# Patient Record
Sex: Female | Born: 1975 | Race: White | Hispanic: No | Marital: Married
Health system: Southern US, Community
[De-identification: ages and names within clinical notes are randomized; demographics above are authoritative.]

---

## 2003-11-02 ENCOUNTER — Other Ambulatory Visit: Admission: RE | Admit: 2003-11-02 | Discharge: 2003-11-02 | Payer: Self-pay | Admitting: Obstetrics and Gynecology

## 2003-12-01 ENCOUNTER — Ambulatory Visit (HOSPITAL_COMMUNITY): Admission: RE | Admit: 2003-12-01 | Discharge: 2003-12-01 | Payer: Self-pay | Admitting: Obstetrics and Gynecology

## 2003-12-01 ENCOUNTER — Encounter (INDEPENDENT_AMBULATORY_CARE_PROVIDER_SITE_OTHER): Payer: Self-pay | Admitting: Specialist

## 2005-01-02 ENCOUNTER — Ambulatory Visit (HOSPITAL_COMMUNITY): Admission: RE | Admit: 2005-01-02 | Discharge: 2005-01-02 | Payer: Self-pay | Admitting: Obstetrics and Gynecology

## 2005-04-07 ENCOUNTER — Inpatient Hospital Stay (HOSPITAL_COMMUNITY): Admission: AD | Admit: 2005-04-07 | Discharge: 2005-04-10 | Payer: Self-pay | Admitting: Obstetrics and Gynecology

## 2005-05-09 ENCOUNTER — Other Ambulatory Visit: Admission: RE | Admit: 2005-05-09 | Discharge: 2005-05-09 | Payer: Self-pay | Admitting: Obstetrics & Gynecology

## 2007-02-15 ENCOUNTER — Ambulatory Visit (HOSPITAL_COMMUNITY): Admission: RE | Admit: 2007-02-15 | Discharge: 2007-02-15 | Payer: Self-pay | Admitting: Obstetrics and Gynecology

## 2007-05-18 ENCOUNTER — Inpatient Hospital Stay (HOSPITAL_COMMUNITY): Admission: AD | Admit: 2007-05-18 | Discharge: 2007-05-20 | Payer: Self-pay | Admitting: Obstetrics & Gynecology

## 2010-08-12 ENCOUNTER — Inpatient Hospital Stay (HOSPITAL_COMMUNITY)
Admission: AD | Admit: 2010-08-12 | Discharge: 2010-08-14 | DRG: 373 | Disposition: A | Discharge status: Home / Self Care | Payer: BC Managed Care – PPO | Source: Ambulatory Visit | Attending: Obstetrics and Gynecology | Admitting: Obstetrics and Gynecology

## 2010-08-12 ENCOUNTER — Inpatient Hospital Stay (HOSPITAL_COMMUNITY): Admission: AD | Admit: 2010-08-12 | Payer: Self-pay | Source: Ambulatory Visit | Admitting: Obstetrics and Gynecology

## 2010-08-12 DIAGNOSIS — O09529 Supervision of elderly multigravida, unspecified trimester: Principal | ICD-10-CM | POA: Diagnosis present

## 2010-08-12 LAB — CBC
HCT: 39.7 % (ref 36.0–46.0)
Hemoglobin: 13.5 g/dL (ref 12.0–15.0)
MCH: 31.8 pg (ref 26.0–34.0)
MCHC: 34 g/dL (ref 30.0–36.0)
MCV: 93.6 fL (ref 78.0–100.0)
RBC: 4.24 MIL/uL (ref 3.87–5.11)

## 2010-08-13 LAB — RPR: RPR Ser Ql: NONREACTIVE

## 2010-08-13 LAB — CBC
HCT: 35.7 % — ABNORMAL LOW (ref 36.0–46.0)
Hemoglobin: 11.8 g/dL — ABNORMAL LOW (ref 12.0–15.0)
MCH: 31.1 pg (ref 26.0–34.0)
MCHC: 33.1 g/dL (ref 30.0–36.0)
MCV: 93.9 fL (ref 78.0–100.0)
RDW: 13.9 % (ref 11.5–15.5)

## 2010-08-14 LAB — RH IMMUNE GLOB WKUP(>/=20WKS)(NOT WOMEN'S HOSP): Unit division: 0

## 2010-10-25 NOTE — Op Note (Signed)
NAME:  Terri Stein, Terri Stein                       ACCOUNT NO.:  [id number]   MEDICAL RECORD NO.:  [id number]                   PATIENT TYPE:  AMB   LOCATION:  [facility]                                  FACILITY:  [facility]   PHYSICIAN:  Carrington Clamp, M.D.              DATE OF BIRTH:  May 25, 1976   DATE OF PROCEDURE:  12/01/2003  DATE OF DISCHARGE:                                 OPERATIVE REPORT   PREOPERATIVE DIAGNOSES:  Missed abortion.   POSTOPERATIVE DIAGNOSES:  Missed abortion.   PROCEDURE:  Suction, dilation and curettage.   SURGEON:  Carrington Clamp, M.D.   ANESTHESIA:  General.   ESTIMATED BLOOD LOSS:  __________   IV FLUIDS:  __________   URINE OUTPUT:  Not measured.   MEDICATIONS:  1% lidocaine with epinephrine and Methergine.   COMPLICATIONS:  None.   FINDINGS:  About a seven week size uterus that sounded to 10 cm that was  down to six week size post procedure. There was good __________.   PATHOLOGY:  Products of conception.   COUNTS:  Correct x3.   TECHNIQUE:  After adequate anesthesia was achieved, the patient was prepped  and draped in the usual sterile fashion in dorsal lithotomy position.  A  single tooth tenaculum was used to grasp the cervix and the cervix injected  with 10 mL of 1% lidocaine with epinephrine. The cervix was then dilated  with [facility] dilators and an 8 mm suction curette passed.  Suction curettage  was performed alternating with sharp curettage until all products of  conception were removed.  The uterus clamped down after the procedure and  Methergine was given to ensure hemostasis.  All instruments were then  withdrawn from the vagina and the patient tolerated the procedure well and  was returned to the recovery room in stable condition.                                               Carrington Clamp, M.D.   MH/MEDQ  D:  12/01/2003  T:  12/02/2003  Job:  [phone]

## 2011-03-17 LAB — CBC
HCT: 30.8 — ABNORMAL LOW
Hemoglobin: 10.8 — ABNORMAL LOW
Hemoglobin: 14.2
MCHC: 35.2
MCHC: 35.2
RBC: 3.26 — ABNORMAL LOW
RBC: 4.28
RDW: 13.7

## 2011-03-17 LAB — RH IMMUNE GLOB WKUP(>/=20WKS)(NOT WOMEN'S HOSP): Fetal Screen: POSITIVE

## 2011-03-17 LAB — KLEIHAUER-BETKE STAIN
# Vials RhIg: 1
Quantitation Fetal Hemoglobin: 5

## 2011-03-17 LAB — RPR: RPR Ser Ql: NONREACTIVE

## 2011-03-21 LAB — RH IMMUNE GLOBULIN WORKUP (NOT WOMEN'S HOSP): Antibody Screen: NEGATIVE

## 2014-04-25 ENCOUNTER — Other Ambulatory Visit: Payer: Self-pay | Admitting: Obstetrics and Gynecology

## 2014-04-27 LAB — CYTOLOGY - PAP

## 2015-10-04 DIAGNOSIS — M545 Low back pain: Secondary | ICD-10-CM | POA: Diagnosis not present

## 2016-02-21 DIAGNOSIS — Z23 Encounter for immunization: Secondary | ICD-10-CM | POA: Diagnosis not present

## 2016-02-21 DIAGNOSIS — T148 Other injury of unspecified body region: Secondary | ICD-10-CM | POA: Diagnosis not present

## 2016-10-09 DIAGNOSIS — M9901 Segmental and somatic dysfunction of cervical region: Secondary | ICD-10-CM | POA: Diagnosis not present

## 2016-10-09 DIAGNOSIS — M9907 Segmental and somatic dysfunction of upper extremity: Secondary | ICD-10-CM | POA: Diagnosis not present

## 2016-10-09 DIAGNOSIS — M7541 Impingement syndrome of right shoulder: Secondary | ICD-10-CM | POA: Diagnosis not present

## 2016-10-09 DIAGNOSIS — M9902 Segmental and somatic dysfunction of thoracic region: Secondary | ICD-10-CM | POA: Diagnosis not present

## 2016-10-14 DIAGNOSIS — M9901 Segmental and somatic dysfunction of cervical region: Secondary | ICD-10-CM | POA: Diagnosis not present

## 2016-10-14 DIAGNOSIS — M7541 Impingement syndrome of right shoulder: Secondary | ICD-10-CM | POA: Diagnosis not present

## 2016-10-14 DIAGNOSIS — M9902 Segmental and somatic dysfunction of thoracic region: Secondary | ICD-10-CM | POA: Diagnosis not present

## 2016-10-14 DIAGNOSIS — M9907 Segmental and somatic dysfunction of upper extremity: Secondary | ICD-10-CM | POA: Diagnosis not present

## 2017-01-13 DIAGNOSIS — J069 Acute upper respiratory infection, unspecified: Secondary | ICD-10-CM | POA: Diagnosis not present

## 2017-03-20 DIAGNOSIS — Z23 Encounter for immunization: Secondary | ICD-10-CM | POA: Diagnosis not present

## 2017-04-07 DIAGNOSIS — Z0000 Encounter for general adult medical examination without abnormal findings: Secondary | ICD-10-CM | POA: Diagnosis not present

## 2017-04-22 DIAGNOSIS — F4322 Adjustment disorder with anxiety: Secondary | ICD-10-CM | POA: Diagnosis not present

## 2017-05-07 DIAGNOSIS — F4322 Adjustment disorder with anxiety: Secondary | ICD-10-CM | POA: Diagnosis not present

## 2017-05-12 DIAGNOSIS — R0989 Other specified symptoms and signs involving the circulatory and respiratory systems: Secondary | ICD-10-CM | POA: Diagnosis not present

## 2017-05-15 DIAGNOSIS — Z6827 Body mass index (BMI) 27.0-27.9, adult: Secondary | ICD-10-CM | POA: Diagnosis not present

## 2017-05-15 DIAGNOSIS — Z01419 Encounter for gynecological examination (general) (routine) without abnormal findings: Secondary | ICD-10-CM | POA: Diagnosis not present

## 2017-05-15 DIAGNOSIS — Z1231 Encounter for screening mammogram for malignant neoplasm of breast: Secondary | ICD-10-CM | POA: Diagnosis not present

## 2017-05-15 DIAGNOSIS — Z124 Encounter for screening for malignant neoplasm of cervix: Secondary | ICD-10-CM | POA: Diagnosis not present

## 2017-05-21 ENCOUNTER — Other Ambulatory Visit: Payer: Self-pay | Admitting: Obstetrics and Gynecology

## 2017-05-21 DIAGNOSIS — R928 Other abnormal and inconclusive findings on diagnostic imaging of breast: Secondary | ICD-10-CM

## 2017-05-27 DIAGNOSIS — F4322 Adjustment disorder with anxiety: Secondary | ICD-10-CM | POA: Diagnosis not present

## 2017-06-12 DIAGNOSIS — F4322 Adjustment disorder with anxiety: Secondary | ICD-10-CM | POA: Diagnosis not present

## 2017-06-24 DIAGNOSIS — F4322 Adjustment disorder with anxiety: Secondary | ICD-10-CM | POA: Diagnosis not present

## 2017-07-15 DIAGNOSIS — F4322 Adjustment disorder with anxiety: Secondary | ICD-10-CM | POA: Diagnosis not present

## 2017-07-20 ENCOUNTER — Ambulatory Visit
Admission: RE | Admit: 2017-07-20 | Discharge: 2017-07-20 | Disposition: A | Discharge status: Home / Self Care | Payer: BC Managed Care – PPO | Source: Ambulatory Visit | Attending: Obstetrics and Gynecology | Admitting: Obstetrics and Gynecology

## 2017-07-20 ENCOUNTER — Ambulatory Visit: Payer: BC Managed Care – PPO

## 2017-07-20 DIAGNOSIS — R922 Inconclusive mammogram: Secondary | ICD-10-CM | POA: Diagnosis not present

## 2017-07-20 DIAGNOSIS — R928 Other abnormal and inconclusive findings on diagnostic imaging of breast: Secondary | ICD-10-CM

## 2017-07-30 DIAGNOSIS — F4322 Adjustment disorder with anxiety: Secondary | ICD-10-CM | POA: Diagnosis not present

## 2017-08-19 DIAGNOSIS — F4322 Adjustment disorder with anxiety: Secondary | ICD-10-CM | POA: Diagnosis not present

## 2018-02-22 DIAGNOSIS — Z111 Encounter for screening for respiratory tuberculosis: Secondary | ICD-10-CM | POA: Diagnosis not present

## 2018-02-22 DIAGNOSIS — Z23 Encounter for immunization: Secondary | ICD-10-CM | POA: Diagnosis not present

## 2018-05-04 DIAGNOSIS — Z13220 Encounter for screening for lipoid disorders: Secondary | ICD-10-CM | POA: Diagnosis not present

## 2018-05-04 DIAGNOSIS — Z131 Encounter for screening for diabetes mellitus: Secondary | ICD-10-CM | POA: Diagnosis not present

## 2018-05-04 DIAGNOSIS — Z8342 Family history of familial hypercholesterolemia: Secondary | ICD-10-CM | POA: Diagnosis not present

## 2018-05-04 DIAGNOSIS — Z0000 Encounter for general adult medical examination without abnormal findings: Secondary | ICD-10-CM | POA: Diagnosis not present

## 2018-12-04 ENCOUNTER — Telehealth: Payer: BC Managed Care – PPO | Admitting: Family

## 2018-12-04 DIAGNOSIS — Z20822 Contact with and (suspected) exposure to covid-19: Secondary | ICD-10-CM

## 2018-12-04 NOTE — Progress Notes (Signed)
E-Visit for Corona Virus Screening   Your current symptoms could be consistent with the coronavirus.  Call your health care provider or local health department to request and arrange formal testing. Many health care providers can now test patients at their office but not all are.  Please quarantine yourself while awaiting your test results.  [facility] [phone], [facility] [phone], [facility] [phone] or visit [email or web address]     COVID-19 is a respiratory illness with symptoms that are similar to the flu. Symptoms are typically mild to moderate, but there have been cases of severe illness and death due to the virus. The following symptoms may appear 2-14 days after exposure: . Fever . Cough . Shortness of breath or difficulty breathing . Chills . Repeated shaking with chills . Muscle pain . Headache . Sore throat . New loss of taste or smell . Fatigue . Congestion or runny nose . Nausea or vomiting . Diarrhea  It is vitally important that if you feel that you have an infection such as this virus or any other virus that you stay home and away from places where you may spread it to others.  You should self-quarantine for 14 days if you have symptoms that could potentially be coronavirus or have been in close contact a with a person diagnosed with COVID-19 within the last 2 weeks. You should avoid contact with people age 65 and older.   You should wear a mask or cloth face covering over your nose and mouth if you must be around other people or animals, including pets (even at home). Try to stay at least 6 feet away from other people. This will protect the people around you.   You may also take acetaminophen (Tylenol) as needed for fever.   Reduce your risk of any infection by using the same precautions used for avoiding the common cold or flu:   . Wash your hands often with soap and warm water for at least 20 seconds.  If soap and water are not readily available, use an alcohol-based hand sanitizer with at least 60% alcohol.  . If coughing or sneezing, cover your mouth and nose by coughing or sneezing into the elbow areas of your shirt or coat, into a tissue or into your sleeve (not your hands). . Avoid shaking hands with others and consider head nods or verbal greetings only. . Avoid touching your eyes, nose, or mouth with unwashed hands.  . Avoid close contact with people who are sick. . Avoid places or events with large numbers of people in one location, like concerts or sporting events. . Carefully consider travel plans you have or are making. . If you are planning any travel outside or inside the [place], visit the CDC's Travelers' Health webpage for the latest health notices. . If you have some symptoms but not all symptoms, continue to monitor at home and seek medical attention if your symptoms worsen. . If you are having a medical emergency, call 911.  HOME CARE . Only take medications as instructed by your medical team. . Drink plenty of fluids and get plenty of rest. . A steam or ultrasonic humidifier can help if you have congestion.   GET HELP RIGHT AWAY IF YOU HAVE EMERGENCY WARNING SIGNS** FOR COVID-19. If you or someone is showing any of these signs seek emergency medical care immediately. Call 911 or proceed to your closest emergency facility if: . You develop worsening high fever. . Trouble   breathing . Bluish lips or face . Persistent pain or pressure in the chest . New confusion . Inability to wake or stay awake . You cough up blood. . Your symptoms become more severe  **This list is not all possible symptoms. Contact your medical provider for any symptoms that are sever or concerning to you.   MAKE SURE YOU   Understand these instructions.  Will watch your condition.  Will get help right away if you are not  doing well or get worse.  Your e-visit answers were reviewed by a board certified advanced clinical practitioner to complete your personal care plan.  Depending on the condition, your plan could have included both over the counter or prescription medications.  If there is a problem please reply once you have received a response from your provider.  Your safety is important to us.  If you have drug allergies check your prescription carefully.    You can use MyChart to ask questions about today's visit, request a non-urgent call back, or ask for a work or school excuse for 24 hours related to this e-Visit. If it has been greater than 24 hours you will need to follow up with your provider, or enter a new e-Visit to address those concerns. You will get an e-mail in the next two days asking about your experience.  I hope that your e-visit has been valuable and will speed your recovery. Thank you for using e-visits.  Greater than 5 minutes, yet less than 10 minutes of time have been spent researching, coordinating, and implementing care for this patient today.  Thank you for the details you included in the comment boxes. Those details are very helpful in determining the best course of treatment for you and help us to provide the best care.  

## 2018-12-05 DIAGNOSIS — Z20828 Contact with and (suspected) exposure to other viral communicable diseases: Secondary | ICD-10-CM | POA: Diagnosis not present

## 2019-01-28 DIAGNOSIS — Z6828 Body mass index (BMI) 28.0-28.9, adult: Secondary | ICD-10-CM | POA: Diagnosis not present

## 2019-01-28 DIAGNOSIS — Z01419 Encounter for gynecological examination (general) (routine) without abnormal findings: Secondary | ICD-10-CM | POA: Diagnosis not present

## 2019-01-28 DIAGNOSIS — Z1231 Encounter for screening mammogram for malignant neoplasm of breast: Secondary | ICD-10-CM | POA: Diagnosis not present

## 2019-01-31 ENCOUNTER — Other Ambulatory Visit: Payer: Self-pay | Admitting: Obstetrics and Gynecology

## 2019-01-31 DIAGNOSIS — R928 Other abnormal and inconclusive findings on diagnostic imaging of breast: Secondary | ICD-10-CM

## 2019-02-03 ENCOUNTER — Other Ambulatory Visit: Payer: Self-pay

## 2019-02-03 ENCOUNTER — Ambulatory Visit
Admission: RE | Admit: 2019-02-03 | Discharge: 2019-02-03 | Disposition: A | Discharge status: Home / Self Care | Payer: BC Managed Care – PPO | Source: Ambulatory Visit | Attending: Obstetrics and Gynecology | Admitting: Obstetrics and Gynecology

## 2019-02-03 DIAGNOSIS — N6002 Solitary cyst of left breast: Secondary | ICD-10-CM | POA: Diagnosis not present

## 2019-02-03 DIAGNOSIS — R928 Other abnormal and inconclusive findings on diagnostic imaging of breast: Secondary | ICD-10-CM

## 2019-04-01 DIAGNOSIS — N760 Acute vaginitis: Secondary | ICD-10-CM | POA: Diagnosis not present

## 2019-08-16 IMAGING — MG DIGITAL DIAGNOSTIC BILATERAL MAMMOGRAM WITH TOMO AND CAD
9 of 12 series · 9 of 28 positions shown · non-contrast
Comparison: Previous exam(s).

CLINICAL DATA: 41-year-old female recalled from screening mammogram
dated 05/15/2017 for a possible right breast mass and left breast
asymmetry.

EXAM:
2D DIGITAL DIAGNOSTIC BILATERAL MAMMOGRAM WITH CAD AND ADJUNCT TOMO

[R CC synth-2D]
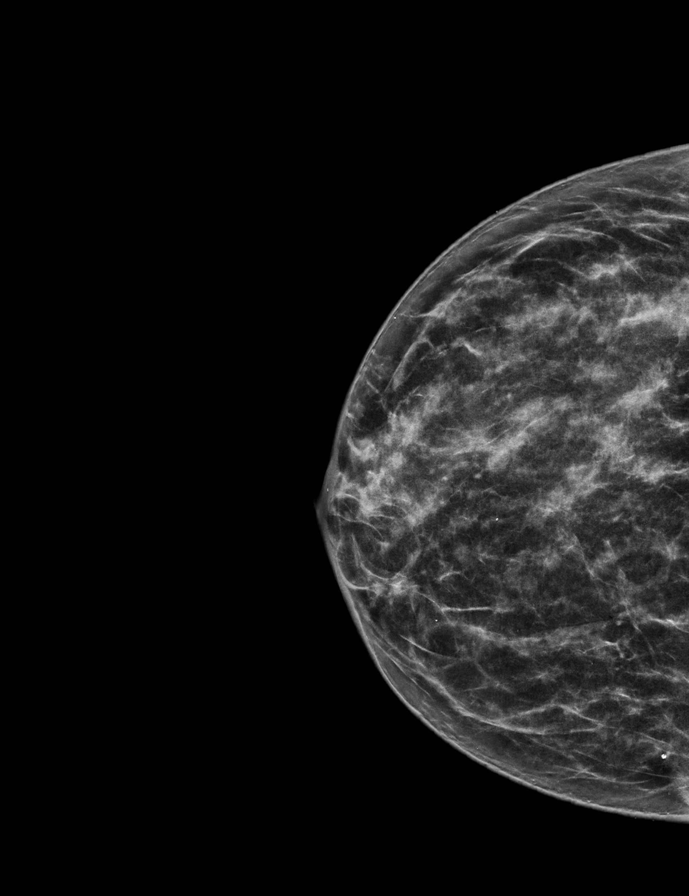

[L MLO]
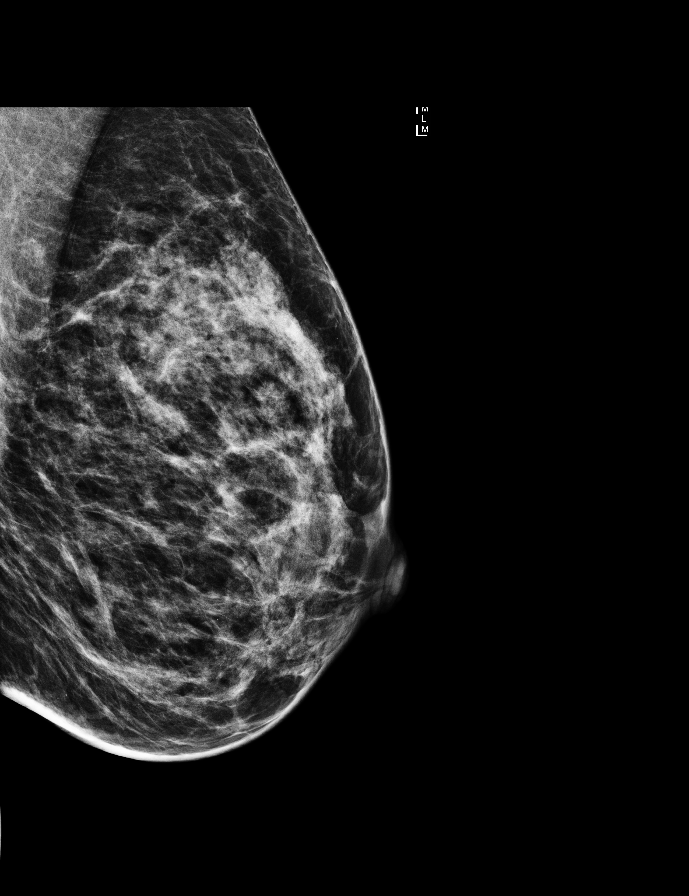

[R MLO synth-2D]
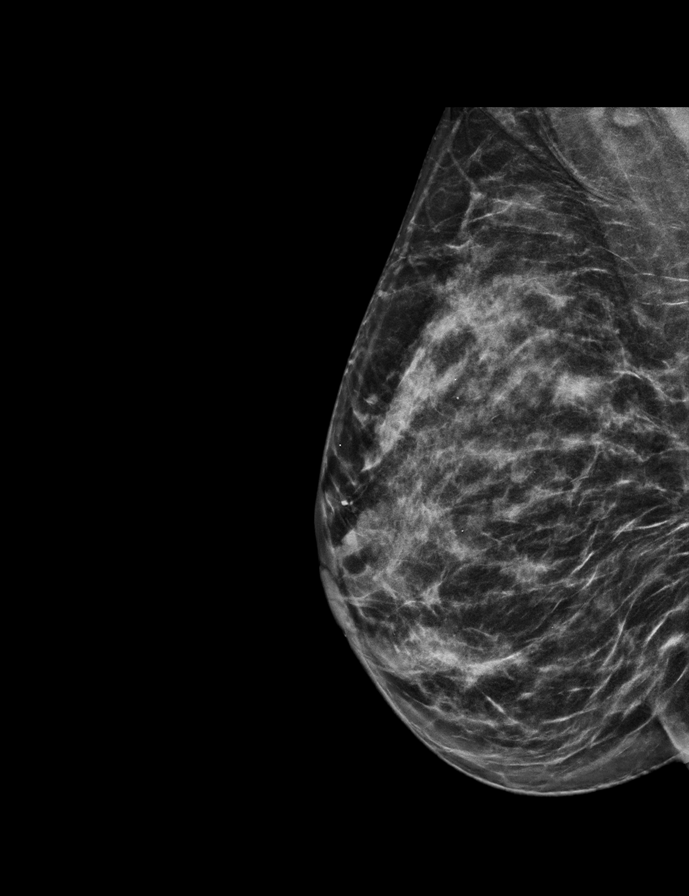

[L MLO synth-2D]
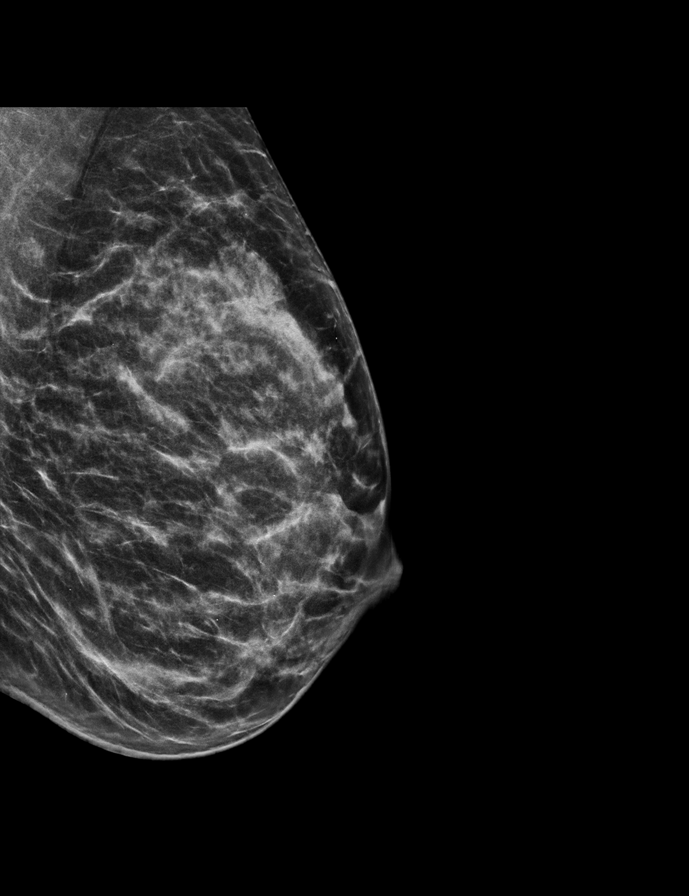

[L CC]
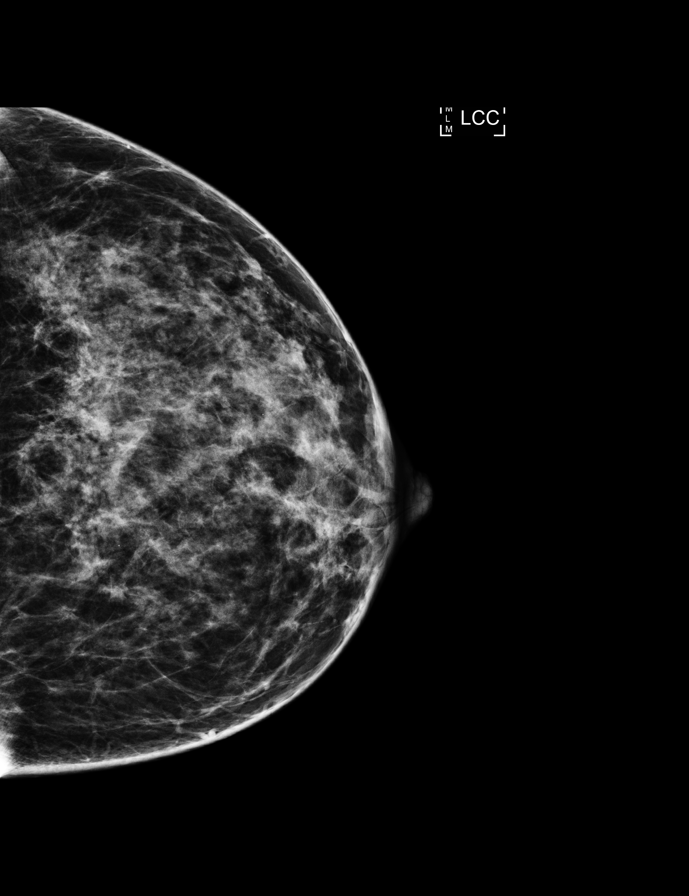

[L CC synth-2D]
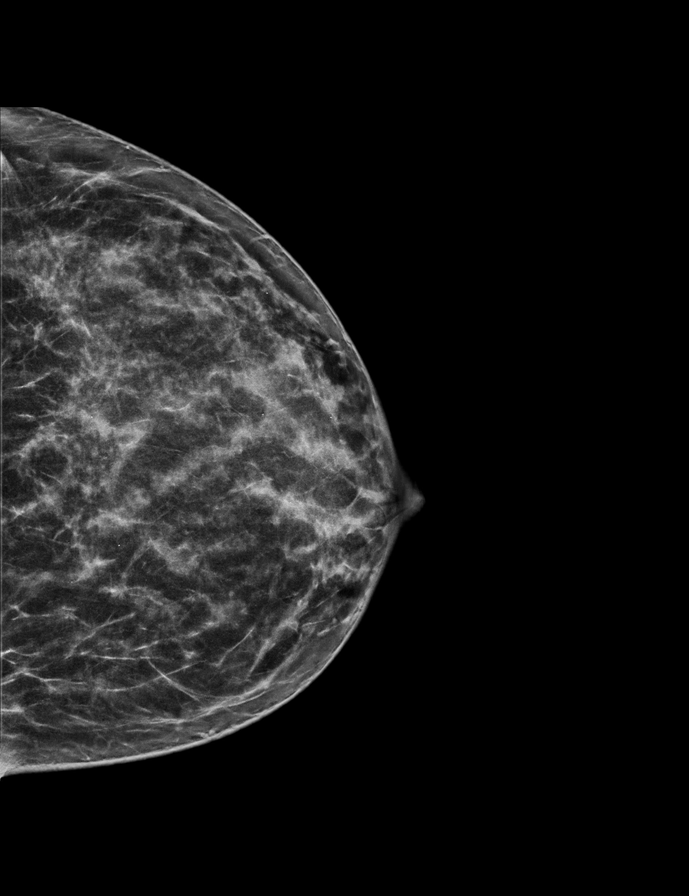

[R MLO]
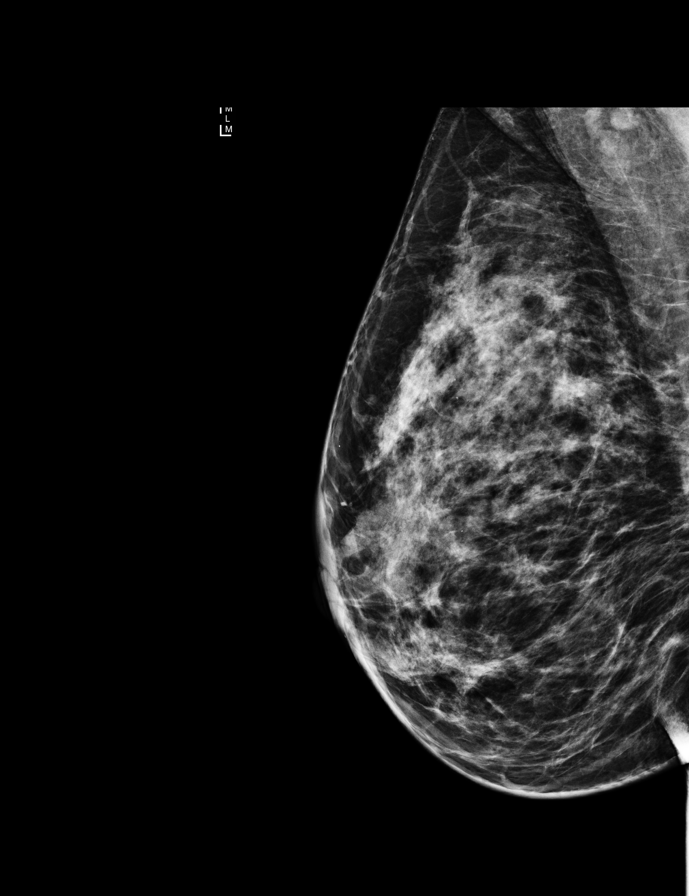

[R CC]
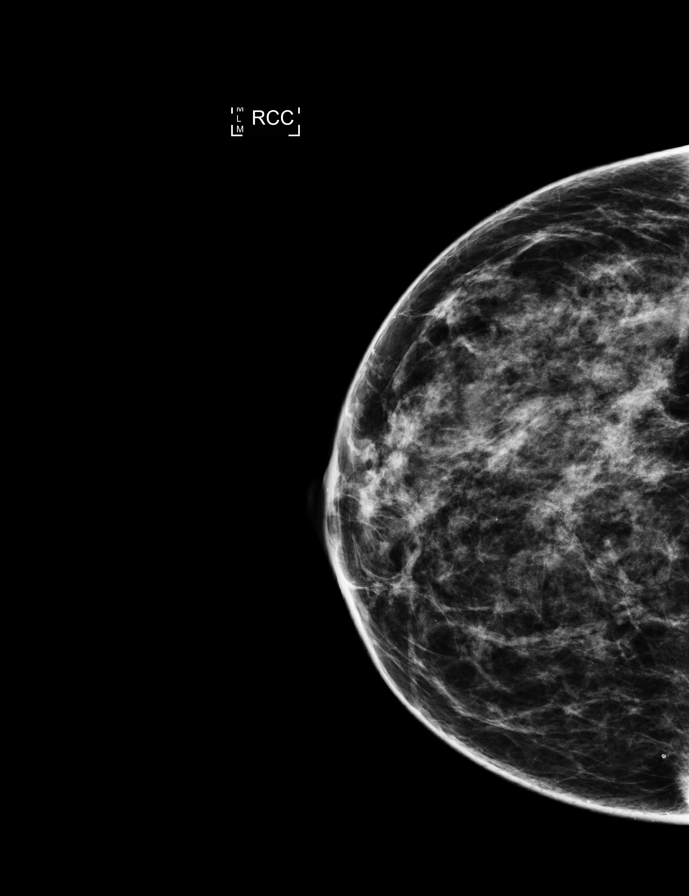

[L CC tomo · tomo slice 29/58.0]
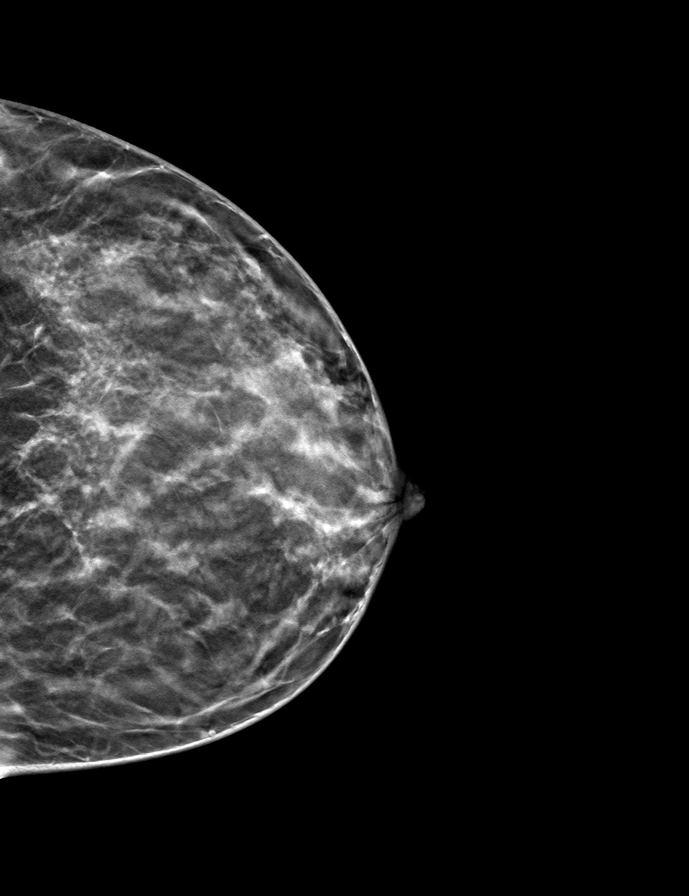

[9 of 28 positions shown; findings below may reference images not displayed]

ACR Breast Density Category c: The breast tissue is heterogeneously
dense, which may obscure small masses.
FINDINGS: The previously described, possible mass in the upper outer right
breast and asymmetry in the superior left breast on the MLO
projection both resolves into well dispersed fatty and
fibroglandular tissue on additional 3D views. No suspicious
mammographic findings are identified.

Mammographic images were processed with CAD.
IMPRESSION: No mammographic evidence of malignancy in either breast.

RECOMMENDATION:
Screening mammogram in one year.(Code:[id number]-0I1)

I have discussed the findings and recommendations with the patient.
Results were also provided in writing at the conclusion of the
visit. If applicable, a reminder letter will be sent to the patient
regarding the next appointment.

BI-RADS CATEGORY  1: Negative.

## 2019-12-08 DIAGNOSIS — Z13220 Encounter for screening for lipoid disorders: Secondary | ICD-10-CM | POA: Diagnosis not present

## 2019-12-08 DIAGNOSIS — Z0000 Encounter for general adult medical examination without abnormal findings: Secondary | ICD-10-CM | POA: Diagnosis not present

## 2019-12-08 DIAGNOSIS — Z131 Encounter for screening for diabetes mellitus: Secondary | ICD-10-CM | POA: Diagnosis not present

## 2020-03-16 DIAGNOSIS — Z01419 Encounter for gynecological examination (general) (routine) without abnormal findings: Secondary | ICD-10-CM | POA: Diagnosis not present

## 2020-03-16 DIAGNOSIS — Z1231 Encounter for screening mammogram for malignant neoplasm of breast: Secondary | ICD-10-CM | POA: Diagnosis not present

## 2020-03-16 DIAGNOSIS — Z6826 Body mass index (BMI) 26.0-26.9, adult: Secondary | ICD-10-CM | POA: Diagnosis not present

## 2020-03-20 DIAGNOSIS — Z800 Family history of malignant neoplasm of digestive organs: Secondary | ICD-10-CM | POA: Diagnosis not present

## 2020-03-20 DIAGNOSIS — Z803 Family history of malignant neoplasm of breast: Secondary | ICD-10-CM | POA: Diagnosis not present

## 2020-04-23 DIAGNOSIS — Z7183 Encounter for nonprocreative genetic counseling: Secondary | ICD-10-CM | POA: Diagnosis not present

## 2020-10-02 DIAGNOSIS — S6991XA Unspecified injury of right wrist, hand and finger(s), initial encounter: Secondary | ICD-10-CM | POA: Diagnosis not present

## 2020-10-09 DIAGNOSIS — S6991XA Unspecified injury of right wrist, hand and finger(s), initial encounter: Secondary | ICD-10-CM | POA: Diagnosis not present

## 2020-12-26 DIAGNOSIS — Z0000 Encounter for general adult medical examination without abnormal findings: Secondary | ICD-10-CM | POA: Diagnosis not present

## 2021-03-01 IMAGING — US ULTRASOUND LEFT BREAST LIMITED
1 series · 5 of 5 positions shown · non-contrast
Comparison: 01/28/2019 and earlier

CLINICAL DATA: Patient returns after screening study for evaluation
of a possible LEFT breast mass.

EXAM:
ULTRASOUND OF THE LEFT BREAST

[Series 1: ultrasound left breast limited · 0.06mm/px · 5 of 5 slices shown]
[im 1/5]
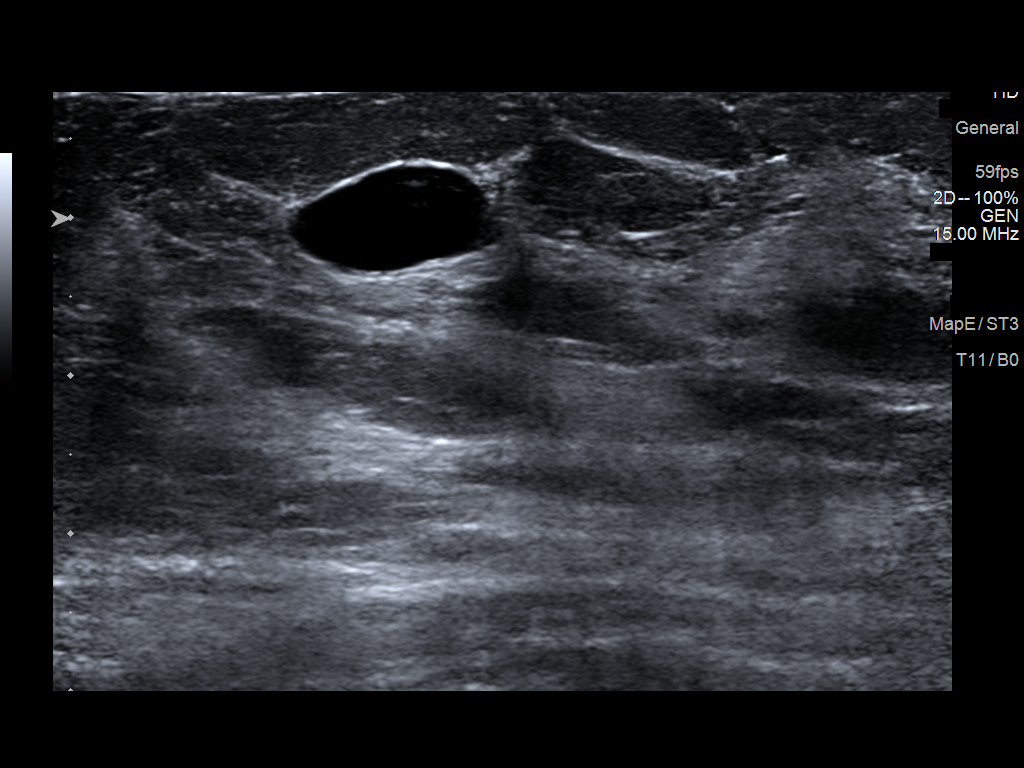
[im 2/5]
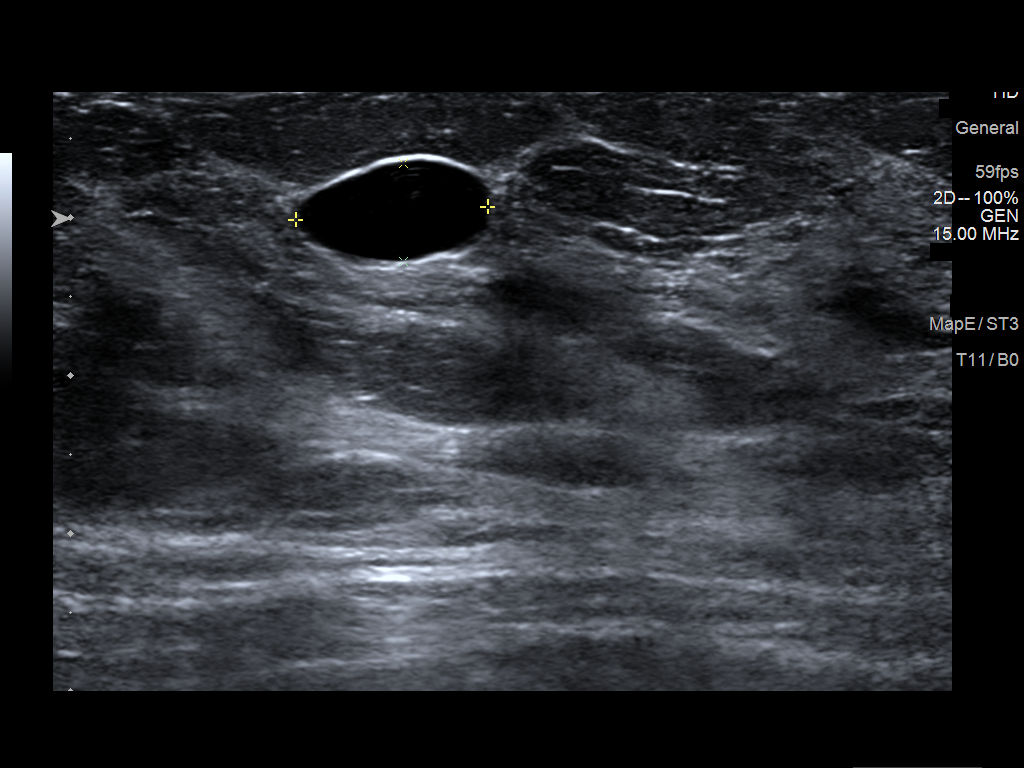
[im 3/5]
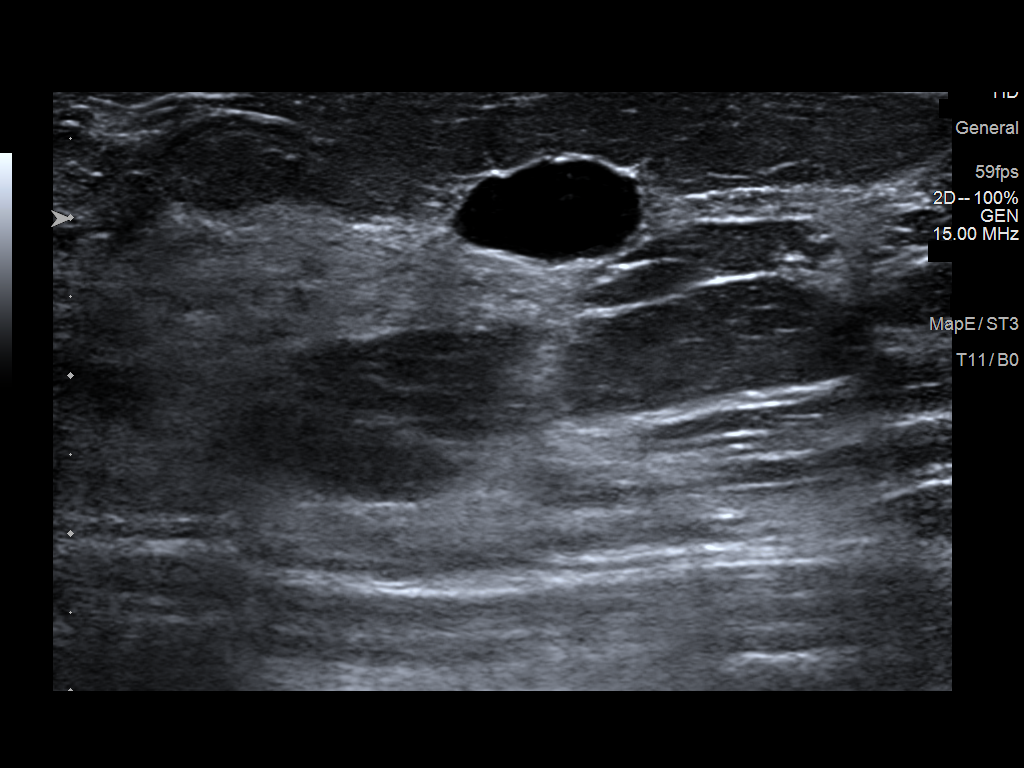
[im 4/5]
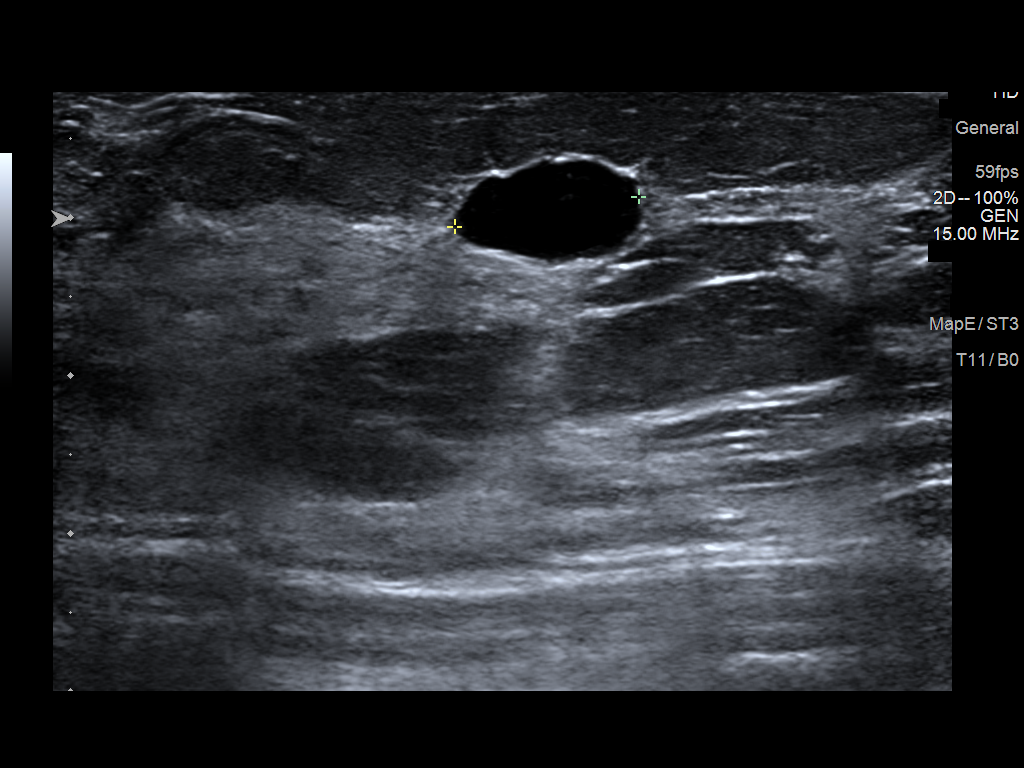
[im 5/5]
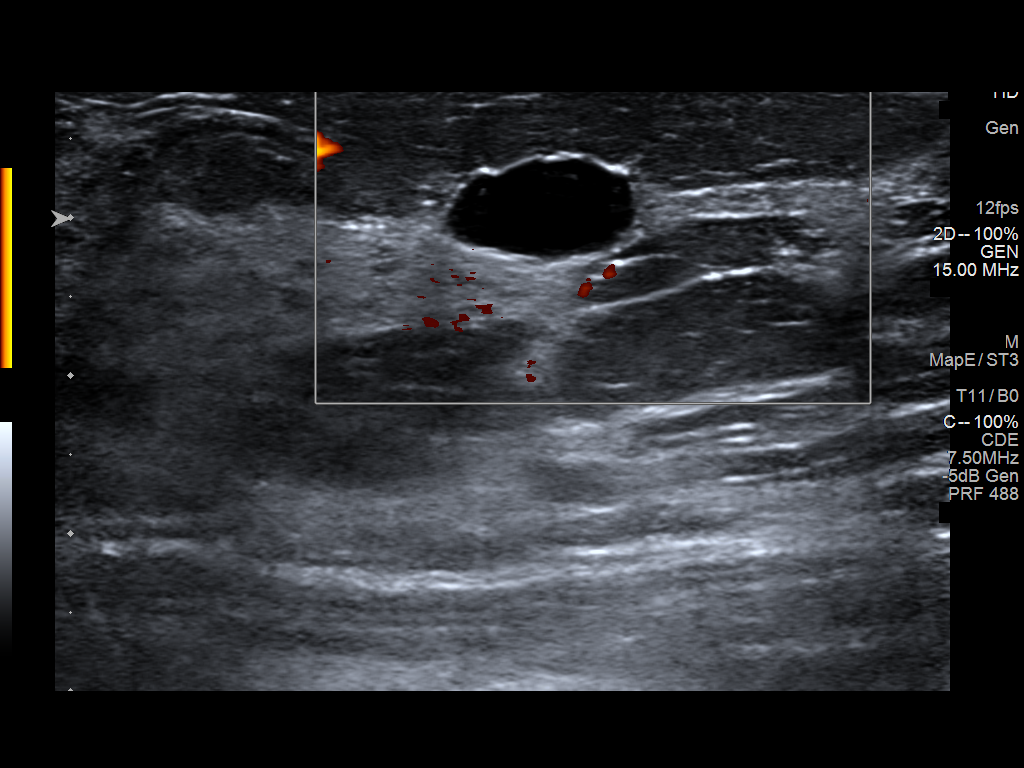

[5 of 5 positions shown; findings below may reference images not displayed]

FINDINGS: Targeted ultrasound is performed, showing a simple cyst in the 12
o'clock location of the LEFT breast 2 centimeters from nipple which
measures 1.2 x 1.2 x 0.6 centimeters. No solid mass or areas of
acoustic shadowing identified.
IMPRESSION: A simple cyst in the 12 o'clock location of the LEFT breast. No
ultrasound evidence for malignancy.

RECOMMENDATION:
Screening mammogram in one year.(Code:GX-Z-7CX)

I have discussed the findings and recommendations with the patient.
Results were also provided in writing at the conclusion of the
visit. If applicable, a reminder letter will be sent to the patient
regarding the next appointment.

BI-RADS CATEGORY  2: Benign.

## 2021-04-25 DIAGNOSIS — Z1211 Encounter for screening for malignant neoplasm of colon: Secondary | ICD-10-CM | POA: Diagnosis not present

## 2021-06-25 DIAGNOSIS — N760 Acute vaginitis: Secondary | ICD-10-CM | POA: Diagnosis not present

## 2021-06-25 DIAGNOSIS — Z6826 Body mass index (BMI) 26.0-26.9, adult: Secondary | ICD-10-CM | POA: Diagnosis not present

## 2021-06-25 DIAGNOSIS — Z124 Encounter for screening for malignant neoplasm of cervix: Secondary | ICD-10-CM | POA: Diagnosis not present

## 2021-06-25 DIAGNOSIS — Z1231 Encounter for screening mammogram for malignant neoplasm of breast: Secondary | ICD-10-CM | POA: Diagnosis not present

## 2021-06-25 DIAGNOSIS — Z01419 Encounter for gynecological examination (general) (routine) without abnormal findings: Secondary | ICD-10-CM | POA: Diagnosis not present

## 2021-07-19 DIAGNOSIS — N6310 Unspecified lump in the right breast, unspecified quadrant: Secondary | ICD-10-CM | POA: Diagnosis not present

## 2021-07-19 DIAGNOSIS — Z6826 Body mass index (BMI) 26.0-26.9, adult: Secondary | ICD-10-CM | POA: Diagnosis not present

## 2021-12-21 DIAGNOSIS — M7661 Achilles tendinitis, right leg: Secondary | ICD-10-CM | POA: Diagnosis not present

## 2022-01-02 DIAGNOSIS — E785 Hyperlipidemia, unspecified: Secondary | ICD-10-CM | POA: Diagnosis not present

## 2022-01-02 DIAGNOSIS — Z0000 Encounter for general adult medical examination without abnormal findings: Secondary | ICD-10-CM | POA: Diagnosis not present

## 2022-01-02 DIAGNOSIS — Z131 Encounter for screening for diabetes mellitus: Secondary | ICD-10-CM | POA: Diagnosis not present

## 2022-02-18 DIAGNOSIS — Z1211 Encounter for screening for malignant neoplasm of colon: Secondary | ICD-10-CM | POA: Diagnosis not present

## 2022-07-03 DIAGNOSIS — Z01419 Encounter for gynecological examination (general) (routine) without abnormal findings: Secondary | ICD-10-CM | POA: Diagnosis not present

## 2022-07-03 DIAGNOSIS — Z1231 Encounter for screening mammogram for malignant neoplasm of breast: Secondary | ICD-10-CM | POA: Diagnosis not present

## 2022-07-07 ENCOUNTER — Other Ambulatory Visit: Payer: Self-pay | Admitting: Obstetrics and Gynecology

## 2022-07-07 DIAGNOSIS — R928 Other abnormal and inconclusive findings on diagnostic imaging of breast: Secondary | ICD-10-CM

## 2022-08-12 ENCOUNTER — Ambulatory Visit
Admission: RE | Admit: 2022-08-12 | Discharge: 2022-08-12 | Disposition: A | Discharge status: Home / Self Care | Payer: Self-pay | Source: Ambulatory Visit | Attending: Obstetrics and Gynecology | Admitting: Obstetrics and Gynecology

## 2022-08-12 ENCOUNTER — Ambulatory Visit
Admission: RE | Admit: 2022-08-12 | Discharge: 2022-08-12 | Disposition: A | Discharge status: Home / Self Care | Payer: BC Managed Care – PPO | Source: Ambulatory Visit | Attending: Obstetrics and Gynecology | Admitting: Obstetrics and Gynecology

## 2022-08-12 ENCOUNTER — Ambulatory Visit: Payer: BC Managed Care – PPO

## 2022-08-12 DIAGNOSIS — R922 Inconclusive mammogram: Secondary | ICD-10-CM | POA: Diagnosis not present

## 2022-08-12 DIAGNOSIS — R928 Other abnormal and inconclusive findings on diagnostic imaging of breast: Secondary | ICD-10-CM

## 2022-08-12 DIAGNOSIS — N6002 Solitary cyst of left breast: Secondary | ICD-10-CM | POA: Diagnosis not present

## 2023-01-15 DIAGNOSIS — E785 Hyperlipidemia, unspecified: Secondary | ICD-10-CM | POA: Diagnosis not present

## 2023-01-15 DIAGNOSIS — Z131 Encounter for screening for diabetes mellitus: Secondary | ICD-10-CM | POA: Diagnosis not present

## 2023-01-15 DIAGNOSIS — Z0000 Encounter for general adult medical examination without abnormal findings: Secondary | ICD-10-CM | POA: Diagnosis not present

## 2023-01-16 DIAGNOSIS — Z1211 Encounter for screening for malignant neoplasm of colon: Secondary | ICD-10-CM | POA: Diagnosis not present

## 2023-07-09 DIAGNOSIS — Z01419 Encounter for gynecological examination (general) (routine) without abnormal findings: Secondary | ICD-10-CM | POA: Diagnosis not present

## 2023-07-09 DIAGNOSIS — Z1231 Encounter for screening mammogram for malignant neoplasm of breast: Secondary | ICD-10-CM | POA: Diagnosis not present

## 2023-07-13 ENCOUNTER — Other Ambulatory Visit: Payer: Self-pay | Admitting: Obstetrics and Gynecology

## 2023-07-13 DIAGNOSIS — R928 Other abnormal and inconclusive findings on diagnostic imaging of breast: Secondary | ICD-10-CM

## 2023-07-24 ENCOUNTER — Ambulatory Visit
Admission: RE | Admit: 2023-07-24 | Discharge: 2023-07-24 | Disposition: A | Discharge status: Home / Self Care | Payer: BC Managed Care – PPO | Source: Ambulatory Visit | Attending: Obstetrics and Gynecology | Admitting: Obstetrics and Gynecology

## 2023-07-24 DIAGNOSIS — R928 Other abnormal and inconclusive findings on diagnostic imaging of breast: Secondary | ICD-10-CM | POA: Diagnosis not present

## 2023-07-24 DIAGNOSIS — N6011 Diffuse cystic mastopathy of right breast: Secondary | ICD-10-CM | POA: Diagnosis not present

## 2023-11-11 DIAGNOSIS — R1013 Epigastric pain: Secondary | ICD-10-CM | POA: Diagnosis not present

## 2023-11-11 DIAGNOSIS — K2900 Acute gastritis without bleeding: Secondary | ICD-10-CM | POA: Diagnosis not present

## 2024-01-18 DIAGNOSIS — Z23 Encounter for immunization: Secondary | ICD-10-CM | POA: Diagnosis not present

## 2024-01-18 DIAGNOSIS — Z0000 Encounter for general adult medical examination without abnormal findings: Secondary | ICD-10-CM | POA: Diagnosis not present

## 2024-01-18 DIAGNOSIS — Z131 Encounter for screening for diabetes mellitus: Secondary | ICD-10-CM | POA: Diagnosis not present

## 2024-01-18 DIAGNOSIS — E782 Mixed hyperlipidemia: Secondary | ICD-10-CM | POA: Diagnosis not present

## 2024-03-24 DIAGNOSIS — Z23 Encounter for immunization: Secondary | ICD-10-CM | POA: Diagnosis not present

## 2024-03-24 DIAGNOSIS — Z1211 Encounter for screening for malignant neoplasm of colon: Secondary | ICD-10-CM | POA: Diagnosis not present
